# Patient Record
Sex: Male | Born: 1970 | Race: White | Hispanic: No
Health system: Southern US, Community
[De-identification: ages and names within clinical notes are randomized; demographics above are authoritative.]

---

## 2004-08-12 ENCOUNTER — Encounter: Admission: RE | Admit: 2004-08-12 | Discharge: 2004-08-12 | Payer: Self-pay | Admitting: Unknown Physician Specialty

## 2007-01-16 ENCOUNTER — Ambulatory Visit: Payer: Self-pay | Admitting: Otolaryngology

## 2007-05-29 ENCOUNTER — Ambulatory Visit: Payer: Self-pay | Admitting: Urology

## 2012-04-16 ENCOUNTER — Ambulatory Visit: Payer: Self-pay | Admitting: Otolaryngology

## 2014-01-07 IMAGING — CT CT PARANASAL SINUSES W/O CM
1 of 2 series · 16 of 30 positions shown, 20 images · non-contrast
Comparison: none

REASON FOR EXAM: chronic sinusitis
COMMENTS:

[Series 4: (person_name) · axial · 0.37mm/px · z∈[-190,-46]mm · 16 of 162 slices shown, 20 images]
[im 9/162  brain]
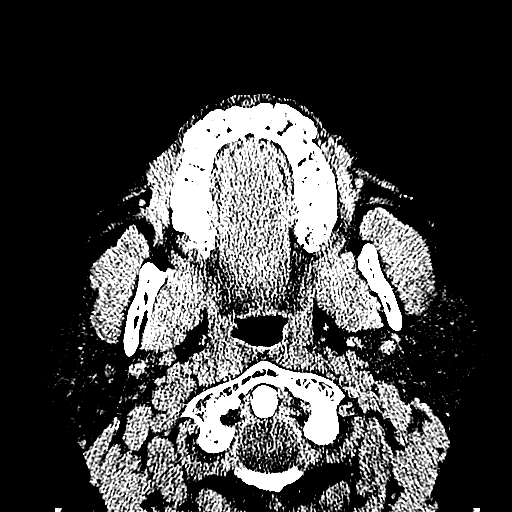
[im 9/162  bone]
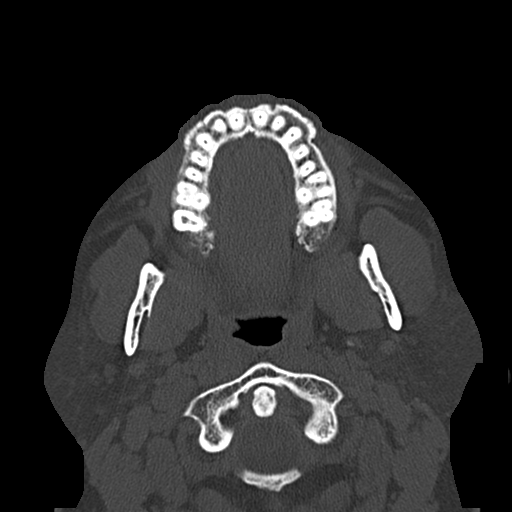
[im 17/162  bone]
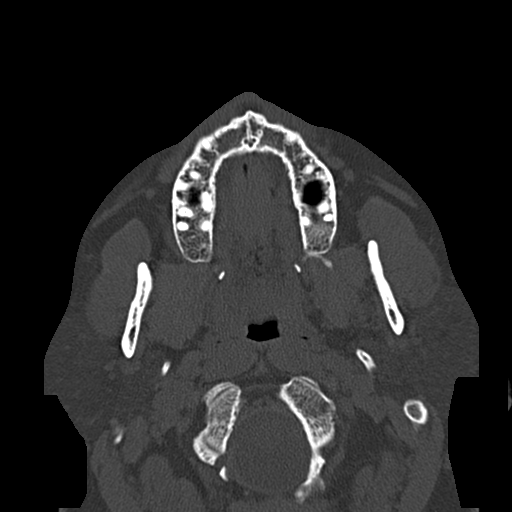
[im 26/162  bone]
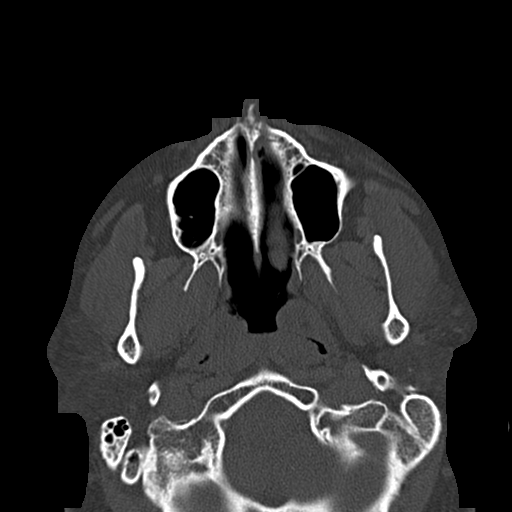
[im 34/162  bone]
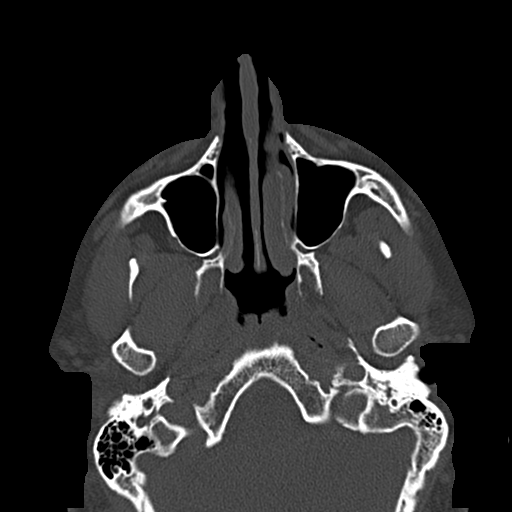
[im 51/162  brain]
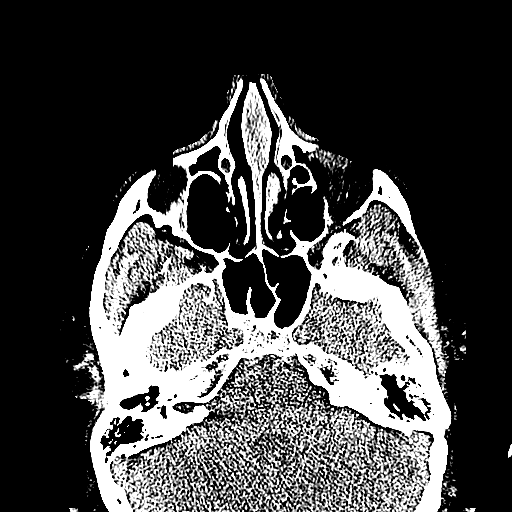
[im 51/162  bone]
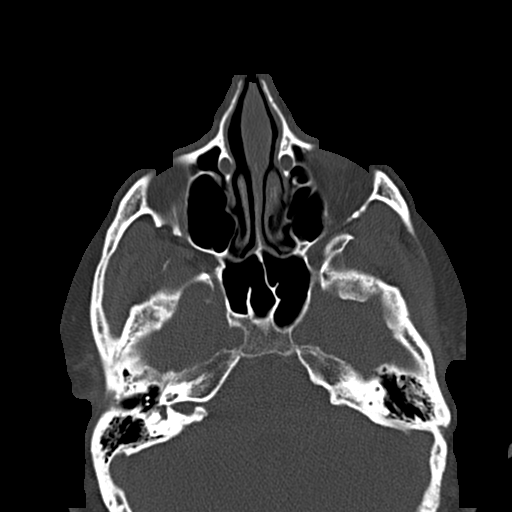
[im 60/162  bone]
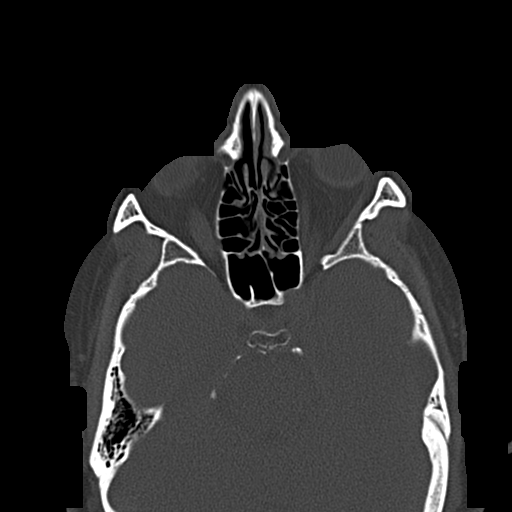
[im 68/162  bone]
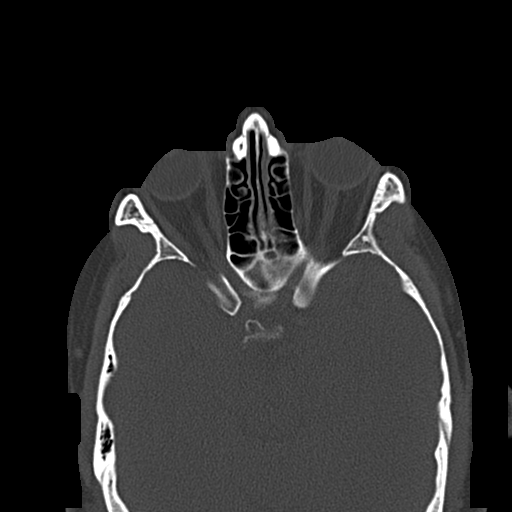
[im 77/162  bone]
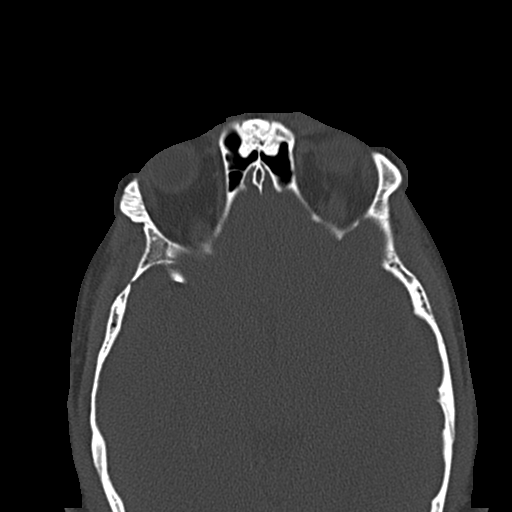
[im 85/162  brain]
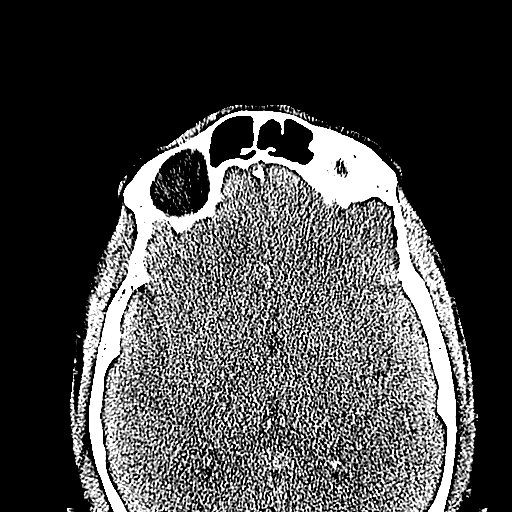
[im 85/162  bone]
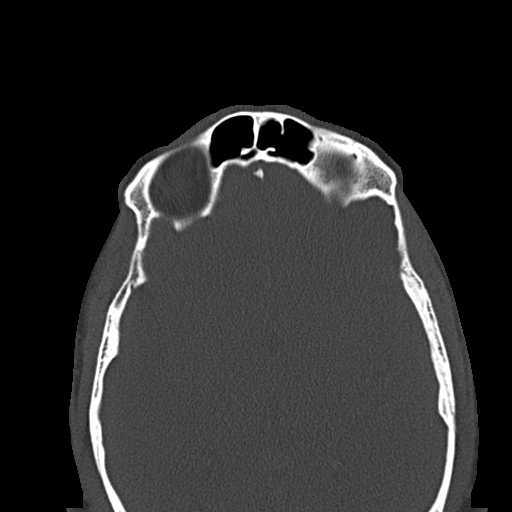
[im 94/162  bone]
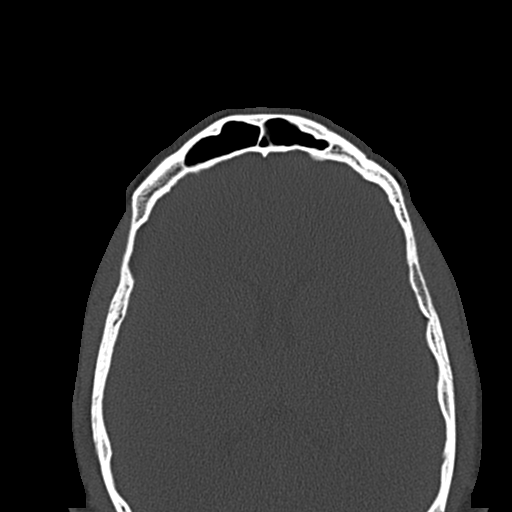
[im 102/162  bone]
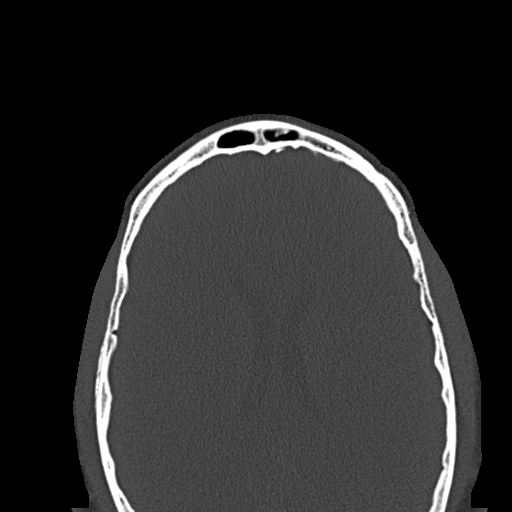
[im 111/162  bone]
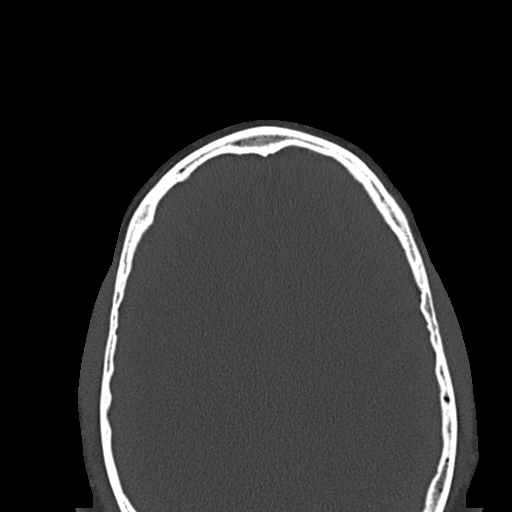
[im 128/162  brain]
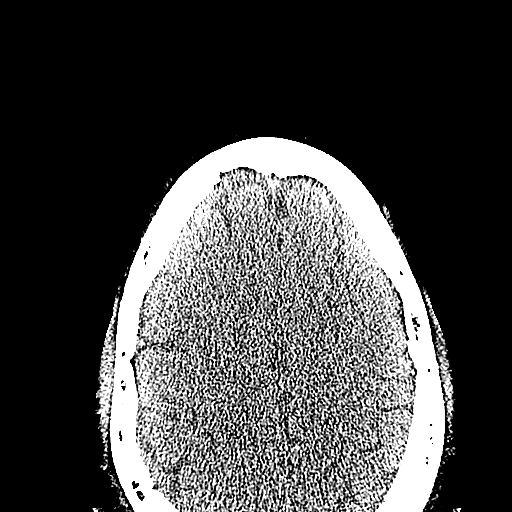
[im 128/162  bone]
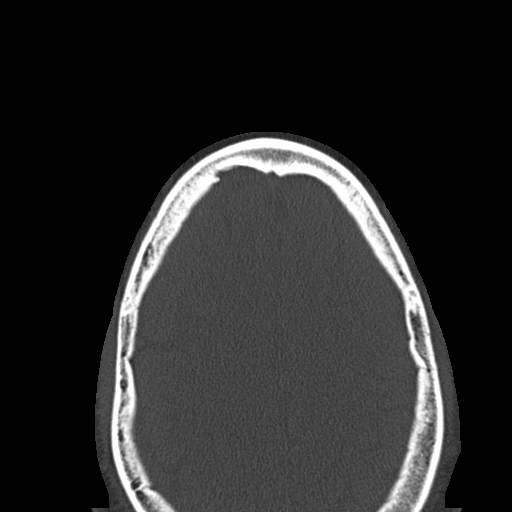
[im 136/162  bone]
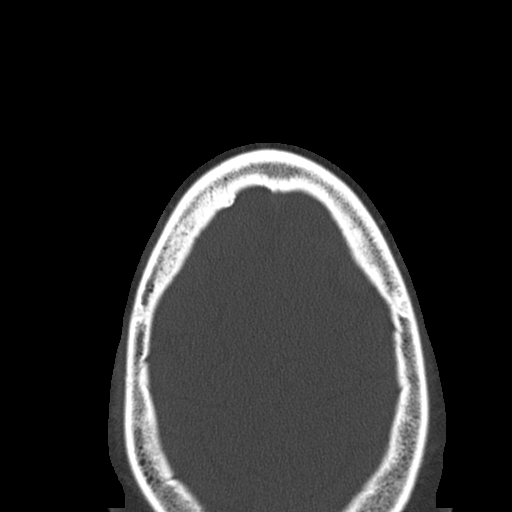
[im 145/162  bone]
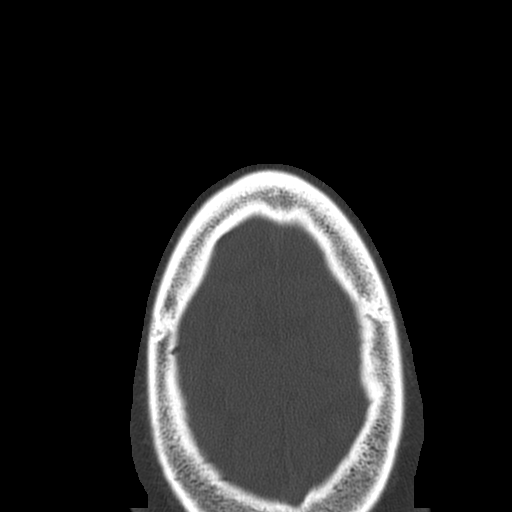
[im 153/162  bone]
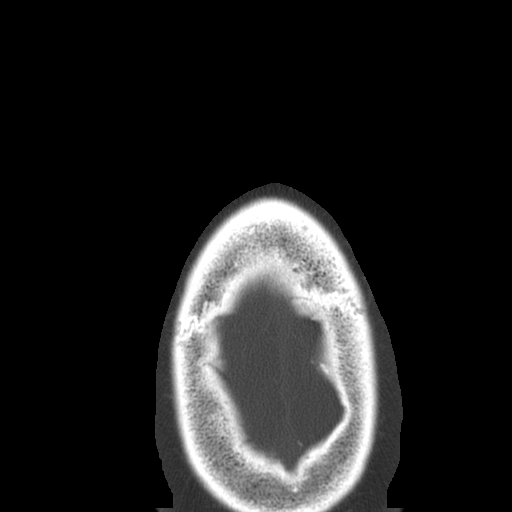

[16 of 30 positions shown; findings below may reference images not displayed]

PROCEDURE:     BAIGES - BAIGES SINUSES WITHOUT CONTRAST  - April 16, 2012 [DATE]

RESULT:     Axial CT scanning was performed through the paranasal sinuses
with reconstructions in the axial plane at 2 mm intervals and slice
thicknesses. Coronal reconstructions were obtained as well. A bone algorithm
was employed.

The frontal sinuses are well pneumatized and clear. The ethmoid and
maxillary and sphenoid sinuses are clear. No air-fluid levels are evident.
There is no evidence of a retention cyst or polyp. The ostiomeatal units of
the paranasal sinuses are clear. The nasal septum is midline.
IMPRESSION: 1. There is no evidence of acute or chronic paranasal sinus inflammation.
2. No bony changes of the sinuses are demonstrated.

[REDACTED]

## 2014-03-09 DIAGNOSIS — E785 Hyperlipidemia, unspecified: Secondary | ICD-10-CM | POA: Insufficient documentation

## 2014-03-09 DIAGNOSIS — F17200 Nicotine dependence, unspecified, uncomplicated: Secondary | ICD-10-CM | POA: Insufficient documentation

## 2014-03-09 DIAGNOSIS — G4733 Obstructive sleep apnea (adult) (pediatric): Secondary | ICD-10-CM | POA: Insufficient documentation

## 2014-03-09 DIAGNOSIS — Z720 Tobacco use: Secondary | ICD-10-CM | POA: Insufficient documentation

## 2014-03-09 DIAGNOSIS — K219 Gastro-esophageal reflux disease without esophagitis: Secondary | ICD-10-CM | POA: Insufficient documentation

## 2014-05-08 DIAGNOSIS — Z471 Aftercare following joint replacement surgery: Secondary | ICD-10-CM | POA: Insufficient documentation

## 2014-09-21 DIAGNOSIS — Z96642 Presence of left artificial hip joint: Secondary | ICD-10-CM | POA: Insufficient documentation

## 2015-07-14 DIAGNOSIS — E119 Type 2 diabetes mellitus without complications: Secondary | ICD-10-CM | POA: Insufficient documentation

## 2016-01-21 DIAGNOSIS — F419 Anxiety disorder, unspecified: Secondary | ICD-10-CM | POA: Insufficient documentation

## 2016-01-21 DIAGNOSIS — F3340 Major depressive disorder, recurrent, in remission, unspecified: Secondary | ICD-10-CM | POA: Insufficient documentation

## 2016-02-28 DIAGNOSIS — M1611 Unilateral primary osteoarthritis, right hip: Secondary | ICD-10-CM | POA: Insufficient documentation

## 2016-07-18 DIAGNOSIS — K5730 Diverticulosis of large intestine without perforation or abscess without bleeding: Secondary | ICD-10-CM | POA: Insufficient documentation

## 2018-07-24 DIAGNOSIS — Z0000 Encounter for general adult medical examination without abnormal findings: Secondary | ICD-10-CM | POA: Insufficient documentation

## 2019-12-12 DIAGNOSIS — L600 Ingrowing nail: Secondary | ICD-10-CM | POA: Insufficient documentation

## 2019-12-23 ENCOUNTER — Ambulatory Visit: Payer: 59 | Admitting: Sports Medicine

## 2020-01-13 ENCOUNTER — Ambulatory Visit (INDEPENDENT_AMBULATORY_CARE_PROVIDER_SITE_OTHER): Payer: 59 | Admitting: Sports Medicine

## 2020-01-13 ENCOUNTER — Other Ambulatory Visit: Payer: Self-pay

## 2020-01-13 ENCOUNTER — Encounter: Payer: Self-pay | Admitting: Sports Medicine

## 2020-01-13 DIAGNOSIS — M79674 Pain in right toe(s): Secondary | ICD-10-CM

## 2020-01-13 DIAGNOSIS — L603 Nail dystrophy: Secondary | ICD-10-CM | POA: Diagnosis not present

## 2020-01-13 DIAGNOSIS — F32A Depression, unspecified: Secondary | ICD-10-CM | POA: Insufficient documentation

## 2020-01-13 DIAGNOSIS — E119 Type 2 diabetes mellitus without complications: Secondary | ICD-10-CM

## 2020-01-13 DIAGNOSIS — Z872 Personal history of diseases of the skin and subcutaneous tissue: Secondary | ICD-10-CM

## 2020-01-13 DIAGNOSIS — F17200 Nicotine dependence, unspecified, uncomplicated: Secondary | ICD-10-CM | POA: Insufficient documentation

## 2020-01-13 DIAGNOSIS — Z8669 Personal history of other diseases of the nervous system and sense organs: Secondary | ICD-10-CM | POA: Insufficient documentation

## 2020-01-13 NOTE — Progress Notes (Signed)
Subjective: Michael Gay is a 49 y.o. male patient with history of diabetes who presents to office today for evaluation of toenails especially the right great toenail reports that the last month it was sore but not right now there is no pain reports that his doctor tried to cut it and made it worse he went to the ER and was given doxycycline and notes better denies any redness swelling drainage or any other acute symptoms.  Patient is diabetic last blood sugar recorded on last week at 111.  A1c 7 and PCP visit, Dr. Sol Passer 3 weeks ago.  No other pedal complaints noted.  Review of system noncontributory  Patient Active Problem List   Diagnosis Date Noted  . History of frequent ear infections in childhood 01/13/2020  . Clinical depression 01/13/2020  . Compulsive tobacco user syndrome 01/13/2020  . Ingrown nail 12/12/2019  . Wellness examination 07/24/2018  . Diverticulosis of colon 07/18/2016  . Primary osteoarthritis of right hip 02/28/2016  . Anxiety 01/21/2016  . Recurrent major depressive disorder, in remission (HCC) 01/21/2016  . Type 2 diabetes mellitus without complication, without long-term current use of insulin (HCC) 07/14/2015  . Presence of left artificial hip joint 09/21/2014  . Aftercare following joint replacement surgery 05/08/2014  . Current smoker 03/09/2014  . Gastroesophageal reflux disease 03/09/2014  . Hyperlipidemia, unspecified 03/09/2014  . OSA (obstructive sleep apnea) 03/09/2014  . Tobacco use 03/09/2014   Current Outpatient Medications on File Prior to Visit  Medication Sig Dispense Refill  . metFORMIN (GLUCOPHAGE) 500 MG tablet Take by mouth 2 (two) times daily with a meal.    . vortioxetine HBr (TRINTELLIX) 5 MG TABS tablet Take 5 mg by mouth daily.     No current facility-administered medications on file prior to visit.   Allergies  Allergen Reactions  . Tramadol Nausea And Vomiting    No results found for this or any previous visit (from the  past 2160 hour(s)).  Objective: General: Patient is awake, alert, and oriented x 3 and in no acute distress.  Integument: Skin is warm, dry and supple bilateral. Nails are short and thick skin with mild incurvation noted at the right lateral hallux nail border with no acute signs of infection no significant warmth redness swelling or drainage very minimal tenderness. No open lesions or preulcerative lesions present bilateral. Remaining integument unremarkable.  Vasculature:  Dorsalis Pedis pulse 2/4 bilateral. Posterior Tibial pulse 1/4 bilateral. Capillary fill time <3 sec 1-5 bilateral. Positive hair growth to the level of the digits.Temperature gradient within normal limits. No varicosities present bilateral. No edema present bilateral.   Neurology: The patient has intact sensation measured with a 5.07/10g Semmes Weinstein Monofilament at all pedal sites bilateral .  Musculoskeletal: No symptomatic pedal deformities noted bilateral. Muscular strength 5/5 in all lower extremity muscular groups bilateral without pain on range of motion . No tenderness with calf compression bilateral.  Assessment and Plan: Problem List Items Addressed This Visit    None    Visit Diagnoses    Encounter for diabetic foot exam (HCC)    -  Primary   Relevant Medications   metFORMIN (GLUCOPHAGE) 500 MG tablet   History of ingrown nail       Nail dystrophy       Toe pain, right          -Examined patient. -Discussed and educated patient on diabetic foot care, especially with  regards to the vascular, neurological and musculoskeletal systems.  -Mechanically debrided right  hallux nail removing any potential offending borders using a sterile nail nipper without incident -Advised patient if soreness recurs to apply antibiotic cream and soak with Epson salt and if worsens to return to office for possible ingrown procedure -Answered all patient questions -Patient to return  in 3 months for at risk foot care if  he decides to proceed with coming routinely for nail care or as needed for ingrown procedure as above -Patient advised to call the office if any problems or questions arise in the meantime.  Asencion Islam, DPM

## 2020-01-13 NOTE — Patient Instructions (Signed)
Diabetes Mellitus and Foot Care Foot care is an important part of your health, especially when you have diabetes. Diabetes may cause you to have problems because of poor blood flow (circulation) to your feet and legs, which can cause your skin to:  Become thinner and drier.  Break more easily.  Heal more slowly.  Peel and crack. You may also have nerve damage (neuropathy) in your legs and feet, causing decreased feeling in them. This means that you may not notice minor injuries to your feet that could lead to more serious problems. Noticing and addressing any potential problems early is the best way to prevent future foot problems. How to care for your feet Foot hygiene  Wash your feet daily with warm water and mild soap. Do not use hot water. Then, pat your feet and the areas between your toes until they are completely dry. Do not soak your feet as this can dry your skin.  Trim your toenails straight across. Do not dig under them or around the cuticle. File the edges of your nails with an emery board or nail file.  Apply a moisturizing lotion or petroleum jelly to the skin on your feet and to dry, brittle toenails. Use lotion that does not contain alcohol and is unscented. Do not apply lotion between your toes. Shoes and socks  Wear clean socks or stockings every day. Make sure they are not too tight. Do not wear knee-high stockings since they may decrease blood flow to your legs.  Wear shoes that fit properly and have enough cushioning. Always look in your shoes before you put them on to be sure there are no objects inside.  To break in new shoes, wear them for just a few hours a day. This prevents injuries on your feet. Wounds, scrapes, corns, and calluses  Check your feet daily for blisters, cuts, bruises, sores, and redness. If you cannot see the bottom of your feet, use a mirror or ask someone for help.  Do not cut corns or calluses or try to remove them with medicine.  If you  find a minor scrape, cut, or break in the skin on your feet, keep it and the skin around it clean and dry. You may clean these areas with mild soap and water. Do not clean the area with peroxide, alcohol, or iodine.  If you have a wound, scrape, corn, or callus on your foot, look at it several times a day to make sure it is healing and not infected. Check for: ? Redness, swelling, or pain. ? Fluid or blood. ? Warmth. ? Pus or a bad smell. General instructions  Do not cross your legs. This may decrease blood flow to your feet.  Do not use heating pads or hot water bottles on your feet. They may burn your skin. If you have lost feeling in your feet or legs, you may not know this is happening until it is too late.  Protect your feet from hot and cold by wearing shoes, such as at the beach or on hot pavement.  Schedule a complete foot exam at least once a year (annually) or more often if you have foot problems. If you have foot problems, report any cuts, sores, or bruises to your health care provider immediately. Contact a health care provider if:  You have a medical condition that increases your risk of infection and you have any cuts, sores, or bruises on your feet.  You have an injury that is not   healing.  You have redness on your legs or feet.  You feel burning or tingling in your legs or feet.  You have pain or cramps in your legs and feet.  Your legs or feet are numb.  Your feet always feel cold.  You have pain around a toenail. Get help right away if:  You have a wound, scrape, corn, or callus on your foot and: ? You have pain, swelling, or redness that gets worse. ? You have fluid or blood coming from the wound, scrape, corn, or callus. ? Your wound, scrape, corn, or callus feels warm to the touch. ? You have pus or a bad smell coming from the wound, scrape, corn, or callus. ? You have a fever. ? You have a red line going up your leg. Summary  Check your feet every day  for cuts, sores, red spots, swelling, and blisters.  Moisturize feet and legs daily.  Wear shoes that fit properly and have enough cushioning.  If you have foot problems, report any cuts, sores, or bruises to your health care provider immediately.  Schedule a complete foot exam at least once a year (annually) or more often if you have foot problems. This information is not intended to replace advice given to you by your health care provider. Make sure you discuss any questions you have with your health care provider. Document Revised: 01/29/2019 Document Reviewed: 06/09/2016 [employer] Patient Education  2020 [employer]
